# Patient Record
Sex: Female | Born: 1990 | Race: White | Hispanic: No | Marital: Single | Smoking: Never smoker
Health system: Southern US, Community
[De-identification: ages and names within clinical notes are randomized; demographics above are authoritative.]

## PROBLEM LIST (undated history)

## (undated) DIAGNOSIS — F419 Anxiety disorder, unspecified: Secondary | ICD-10-CM

## (undated) DIAGNOSIS — R519 Headache, unspecified: Secondary | ICD-10-CM

## (undated) DIAGNOSIS — R51 Headache: Secondary | ICD-10-CM

---
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
## 2017-06-09 ENCOUNTER — Other Ambulatory Visit: Payer: Self-pay

## 2017-06-09 ENCOUNTER — Encounter (HOSPITAL_COMMUNITY): Payer: Self-pay | Admitting: Emergency Medicine

## 2017-06-09 ENCOUNTER — Emergency Department (HOSPITAL_COMMUNITY)
Admission: EM | Admit: 2017-06-09 | Discharge: 2017-06-09 | Disposition: A | Discharge status: Home / Self Care | Payer: BLUE CROSS/BLUE SHIELD | Attending: Emergency Medicine | Admitting: Emergency Medicine

## 2017-06-09 ENCOUNTER — Emergency Department (HOSPITAL_COMMUNITY)
Admission: EM | Admit: 2017-06-09 | Discharge: 2017-06-09 | Discharge status: Against Medical Advice | Payer: BLUE CROSS/BLUE SHIELD | Source: Home / Self Care

## 2017-06-09 DIAGNOSIS — Z79899 Other long term (current) drug therapy: Secondary | ICD-10-CM | POA: Diagnosis not present

## 2017-06-09 DIAGNOSIS — L923 Foreign body granuloma of the skin and subcutaneous tissue: Secondary | ICD-10-CM

## 2017-06-09 DIAGNOSIS — L089 Local infection of the skin and subcutaneous tissue, unspecified: Secondary | ICD-10-CM | POA: Diagnosis not present

## 2017-06-09 MED ORDER — SULFAMETHOXAZOLE-TRIMETHOPRIM 800-160 MG PO TABS
1.0000 | ORAL_TABLET | Freq: Once | ORAL | Status: AC
  Administered 2017-06-09: 1 via ORAL
  Filled 2017-06-09: qty 1

## 2017-06-09 MED ORDER — SULFAMETHOXAZOLE-TRIMETHOPRIM 800-160 MG PO TABS: 1.0000 | ORAL_TABLET | Freq: Two times a day (BID) | ORAL | 0 refills | Status: AC

## 2017-06-09 MED ORDER — DIPHENHYDRAMINE HCL 25 MG PO CAPS
25.0000 mg | ORAL_CAPSULE | Freq: Once | ORAL | Status: DC
  Filled 2017-06-09: qty 1

## 2017-06-09 NOTE — ED Triage Notes (Signed)
Pt had tattoo 2 days ago, pt developed redness and irritation in R upper arm since yesterday. Pt states arm feels edematous.

## 2017-06-09 NOTE — ED Provider Notes (Signed)
[facility] DEPT Provider Note   CSN: [id number] Arrival date & time: 06/09/17  1546     History   Chief Complaint Chief Complaint  Patient presents with  . erythema R upper arm    HPI Angela Hurley is a 26 y.o. female who presents to the ED with redness and irritation to the right upper arm that started yesterday. Patient reports having a new tattoo 2 days ago. Patient states she took a picture of the area and sent to her PCP and was told to come to the ED. Patient states the redness is much less now than in the picture. Patient denies fever, chills other other problems.   HPI  History reviewed. No pertinent past medical history.  There are no active problems to display for this patient.   History reviewed. No pertinent surgical history.  OB History    No data available       Home Medications    Prior to Admission medications   Medication Sig Start Date End Date Taking? Authorizing Provider  CRYSELLE-28 0.3-30 MG-MCG tablet Take 1 tablet by mouth daily. 05/21/17   [provider]  DULoxetine (CYMBALTA) 30 MG capsule Take 60 mg by mouth daily. 05/15/17   [provider]  LORazepam (ATIVAN) 0.5 MG tablet Take 0.5 mg by mouth daily as needed. 04/17/17   [provider]  sulfamethoxazole-trimethoprim (BACTRIM DS,SEPTRA DS) 800-160 MG tablet Take 1 tablet by mouth 2 (two) times daily for 7 days. 06/09/17 06/16/17  Ashley Murrain, NP    Family History History reviewed. No pertinent family history.  Social History Social History   Tobacco Use  . Smoking status: Never Smoker  . Smokeless tobacco: Never Used  Substance Use Topics  . Alcohol use: No    Frequency: Never  . Drug use: No     Allergies   Doxycycline and Penicillins   Review of Systems Review of Systems  Skin: Positive for color change and rash.  All other systems reviewed and are negative.    Physical Exam Updated Vital Signs BP (!)  146/98 (BP Location: Left Arm)   Pulse (!) 102   Temp 98.4 F (36.9 C) (Oral)   Resp 18   LMP 05/28/2017 (Exact Date)   SpO2 94%   Physical Exam  Constitutional: She appears well-developed and well-nourished. No distress.  HENT:  Head: Normocephalic.  Eyes: EOM are normal.  Neck: Neck supple.  Cardiovascular: Normal rate.  Pulmonary/Chest: Effort normal.  Musculoskeletal: Normal range of motion.  Right upper arm with new large tattoo. There is mild erythema to the palmar aspect of the right upper arm, no red streaking, no drainage. Radial pulse 2+, adequate circulation.   Neurological: She is alert.  Skin: Skin is warm and dry. Rash noted.  Psychiatric: She has a normal mood and affect. Her behavior is normal.  Nursing note and vitals reviewed.    ED Treatments / Results  Labs (all labs ordered are listed, but only abnormal results are displayed) Labs Reviewed - No data to display   Radiology No results found.  Procedures Procedures (including critical care time)  Medications Ordered in ED Medications  sulfamethoxazole-trimethoprim (BACTRIM DS,SEPTRA DS) 800-160 MG per tablet 1 tablet (not administered)  diphenhydrAMINE (BENADRYL) capsule 25 mg (not administered)     Initial Impression / Assessment and Plan / ED Course  I have reviewed the triage vital signs and the nursing notes. 26 y.o. female with erythema to the right upper  arm that has improved since initially noted. Patient stable for d/c without fever, red streaking or drainage noted. Will treat for possible early infection and for possible allergic reaction. Patient allergic to Penicillin and Doxycycline. Will start Bactrim and she will f/u with her PCP or return here for worsening symptoms.   Final Clinical Impressions(s) / ED Diagnoses   Final diagnoses:  Skin infection  Tattoo reaction    ED Discharge Orders        Ordered    sulfamethoxazole-trimethoprim (BACTRIM DS,SEPTRA DS) 800-160 MG tablet  2  times daily     06/09/17 1901       Debroah Baller Embden, NP 06/09/17 Pauline Aus    Charlesetta Shanks, MD 06/13/17 [id number]

## 2017-06-09 NOTE — Discharge Instructions (Signed)
We are treating for your possible allergic reaction to the color in the tattoo or for possible infection of the skin due to the new tattoo. Take the antibiotic and take Benadryl. Follow up with your primary care doctor or return here for worsening symptoms.

## 2017-12-20 ENCOUNTER — Encounter (HOSPITAL_COMMUNITY): Payer: Self-pay | Admitting: *Deleted

## 2017-12-20 ENCOUNTER — Other Ambulatory Visit: Payer: Self-pay

## 2017-12-20 ENCOUNTER — Inpatient Hospital Stay (HOSPITAL_COMMUNITY)
Admission: AD | Admit: 2017-12-20 | Discharge: 2017-12-20 | Disposition: A | Discharge status: Home / Self Care | Payer: BLUE CROSS/BLUE SHIELD | Attending: Obstetrics & Gynecology | Admitting: Obstetrics & Gynecology

## 2017-12-20 DIAGNOSIS — N939 Abnormal uterine and vaginal bleeding, unspecified: Secondary | ICD-10-CM | POA: Diagnosis not present

## 2017-12-20 DIAGNOSIS — Z880 Allergy status to penicillin: Secondary | ICD-10-CM | POA: Diagnosis not present

## 2017-12-20 DIAGNOSIS — R109 Unspecified abdominal pain: Secondary | ICD-10-CM | POA: Insufficient documentation

## 2017-12-20 DIAGNOSIS — Z881 Allergy status to other antibiotic agents status: Secondary | ICD-10-CM | POA: Insufficient documentation

## 2017-12-20 DIAGNOSIS — Z3202 Encounter for pregnancy test, result negative: Secondary | ICD-10-CM

## 2017-12-20 DIAGNOSIS — R51 Headache: Secondary | ICD-10-CM | POA: Insufficient documentation

## 2017-12-20 HISTORY — DX: Headache: R51

## 2017-12-20 HISTORY — DX: Headache, unspecified: R51.9

## 2017-12-20 LAB — URINALYSIS, ROUTINE W REFLEX MICROSCOPIC
BACTERIA UA: NONE SEEN
Bilirubin Urine: NEGATIVE
Glucose, UA: NEGATIVE mg/dL
KETONES UR: NEGATIVE mg/dL
Leukocytes, UA: NEGATIVE
Nitrite: NEGATIVE
PH: 8 (ref 5.0–8.0)
Protein, ur: NEGATIVE mg/dL
SPECIFIC GRAVITY, URINE: 1.016 (ref 1.005–1.030)

## 2017-12-20 LAB — CBC
HEMATOCRIT: 40.3 % (ref 36.0–46.0)
HEMOGLOBIN: 13.5 g/dL (ref 12.0–15.0)
MCH: 30.8 pg (ref 26.0–34.0)
MCHC: 33.5 g/dL (ref 30.0–36.0)
MCV: 91.8 fL (ref 78.0–100.0)
PLATELETS: 347 10*3/uL (ref 150–400)
RBC: 4.39 MIL/uL (ref 3.87–5.11)
RDW: 13.2 % (ref 11.5–15.5)
WBC: 7.9 10*3/uL (ref 4.0–10.5)

## 2017-12-20 LAB — POCT PREGNANCY, URINE: Preg Test, Ur: NEGATIVE

## 2017-12-20 MED ORDER — MEGESTROL ACETATE 40 MG PO TABS: 40.0000 mg | ORAL_TABLET | Freq: Three times a day (TID) | ORAL | 0 refills | Status: DC

## 2017-12-20 NOTE — MAU Note (Signed)
Started having vaginal bleeding on July 4th; has not stopped.  +blood clots "golf ball to fist sized clots" +fatigue +lower abdominal and back pain Rating pain 5/10. Has tried Pamprin last night with no relief Yesterday to now bleeding through pads and tampons; 3 times it went through her clothes. Reports changing tampon and pad every 10 minutes.  This has been a problem for the last 4-5 months with heavy periods and spotting between.  Vitals:   12/20/17 0958  BP: 138/89  Pulse: 98  Resp: 18  Temp: 98.1 F (36.7 C)  SpO2: 98%

## 2017-12-20 NOTE — MAU Provider Note (Signed)
History     CSN: [id number]  Arrival date and time: 12/20/17 0941   First Provider Initiated Contact with Patient 12/20/17 1018      Chief Complaint  Patient presents with  . Vaginal Bleeding   HPI  Angela Hurley is a 27 y.o. non pregnant female who presents with vaginal bleeding.  Reports heavier periods for the last 4 months. Current bleeding started on 7/4. Has alternated heavy & light but extremely heavy since yesterday. Reports bleeding through a tampon and pad 4 times last night. Has been passing clots that are golf ball to fist sized. Has also had worsening abdominal cramping. Rates pain 6/10. Has been taking pamprin with minimal relief. Denies any change in medications and states she doesn't miss doses of her OCPs. Has been on OCPs since she was 27 yrs old.  Has seen her PCP for menstrual issues and has not yet been referred to ob/gyn. Presented today d/t heavy bleeding.  She has not been sexually active since October 2018. Had a normal pap smear 2 years ago.   Past Medical History:  Diagnosis Date  . Headache     Past Surgical History:  Procedure Laterality Date  . WISDOM TOOTH EXTRACTION Bilateral 2008    History reviewed. No pertinent family history.  Social History   Tobacco Use  . Smoking status: Never Smoker  . Smokeless tobacco: Never Used  Substance Use Topics  . Alcohol use: No    Frequency: Never  . Drug use: No    Allergies:  Allergies  Allergen Reactions  . Doxycycline Nausea And Vomiting  . Penicillins Hives    Medications Prior to Admission  Medication Sig Dispense Refill Last Dose  . CRYSELLE-28 0.3-30 MG-MCG tablet Take 1 tablet by mouth daily.  2 12/19/2017 at Unknown time  . DULoxetine (CYMBALTA) 30 MG capsule Take 60 mg by mouth daily.  2 12/19/2017 at Unknown time  . LORazepam (ATIVAN) 0.5 MG tablet Take 0.5 mg by mouth daily as needed.  0 Unknown at Unknown time    Review of Systems  Constitutional: Negative.    Gastrointestinal: Positive for abdominal pain. Negative for constipation, diarrhea, nausea and vomiting.  Genitourinary: Positive for menstrual problem and vaginal bleeding.   Physical Exam   Blood pressure 131/76, pulse 87, temperature 98.1 F (36.7 C), temperature source Oral, resp. rate 18, weight 205 lb 0.6 oz (93 kg), last menstrual period 12/11/2017, SpO2 98 %.  Physical Exam  Nursing note and vitals reviewed. Constitutional: She is oriented to person, place, and time. She appears well-developed and well-nourished. No distress.  HENT:  Head: Normocephalic and atraumatic.  Eyes: Conjunctivae are normal. Right eye exhibits no discharge. Left eye exhibits no discharge. No scleral icterus.  Neck: Normal range of motion.  Cardiovascular: Normal rate, regular rhythm and normal heart sounds.  No murmur heard. Respiratory: Effort normal and breath sounds normal. No respiratory distress. She has no wheezes.  GI: Soft. There is no tenderness.  Genitourinary: Uterus is enlarged. Uterus is not tender. Cervix exhibits no motion tenderness and no friability. Right adnexum displays no mass and no tenderness. Left adnexum displays no mass and no tenderness. There is bleeding in the vagina.  Genitourinary Comments: No clots. Small amount of dark red blood cleared out with 1 fox swab.   Neurological: She is alert and oriented to person, place, and time.  Skin: Skin is warm and dry. She is not diaphoretic.  Psychiatric: She has a normal mood and affect. Her  behavior is normal. Judgment and thought content normal.    MAU Course  Procedures Results for orders placed or performed during the hospital encounter of 12/20/17 (from the past 24 hour(s))  Urinalysis, Routine w reflex microscopic     Status: Abnormal   Collection Time: 12/20/17 10:02 AM  Result Value Ref Range   Color, Urine YELLOW YELLOW   APPearance CLEAR CLEAR   Specific Gravity, Urine 1.016 1.005 - 1.030   pH 8.0 5.0 - 8.0    Glucose, UA NEGATIVE NEGATIVE mg/dL   Hgb urine dipstick MODERATE (A) NEGATIVE   Bilirubin Urine NEGATIVE NEGATIVE   Ketones, ur NEGATIVE NEGATIVE mg/dL   Protein, ur NEGATIVE NEGATIVE mg/dL   Nitrite NEGATIVE NEGATIVE   Leukocytes, UA NEGATIVE NEGATIVE   RBC / HPF 0-5 0 - 5 RBC/hpf   WBC, UA 0-5 0 - 5 WBC/hpf   Bacteria, UA NONE SEEN NONE SEEN  Pregnancy, urine POC     Status: None   Collection Time: 12/20/17 10:05 AM  Result Value Ref Range   Preg Test, Ur NEGATIVE NEGATIVE  CBC     Status: None   Collection Time: 12/20/17 10:53 AM  Result Value Ref Range   WBC 7.9 4.0 - 10.5 K/uL   RBC 4.39 3.87 - 5.11 MIL/uL   Hemoglobin 13.5 12.0 - 15.0 g/dL   HCT 40.3 36.0 - 46.0 %   MCV 91.8 78.0 - 100.0 fL   MCH 30.8 26.0 - 34.0 pg   MCHC 33.5 30.0 - 36.0 g/dL   RDW 13.2 11.5 - 15.5 %   Platelets 347 150 - 400 K/uL    MDM UPT negative CBC -- hemoglobin 13.5 VSS Small amount of blood on exam Declines pain meds at this time  Assessment and Plan  A: 1. Abnormal uterine bleeding (AUB)   2. Pregnancy examination or test, negative result    P: Discharge home D/c OCPs Rx megace 40 mg TID (can decreased to BID when bleeding resolves) Ibuprofen on schedule  Msg to [facility] for f/u appt Outpatient pelvic ultrasound ordered  Jorje Guild 12/20/2017, 10:19 AM

## 2017-12-20 NOTE — Discharge Instructions (Signed)
Dysfunctional Uterine Bleeding °Dysfunctional uterine bleeding is abnormal bleeding from the uterus. Dysfunctional uterine bleeding includes: °· A period that comes earlier or later than usual. °· A period that is lighter, heavier, or has blood clots. °· Bleeding between periods. °· Skipping one or more periods. °· Bleeding after sexual intercourse. °· Bleeding after menopause. ° °Follow these instructions at home: °Pay attention to any changes in your symptoms. Follow these instructions to help with your condition: °Eating and drinking °· Eat well-balanced meals. Include foods that are high in iron, such as liver, meat, shellfish, green leafy vegetables, and eggs. °· If you become constipated: °? Drink plenty of water. °? Eat fruits and vegetables that are high in water and fiber, such as spinach, carrots, raspberries, apples, and mango. °Medicines °· Take over-the-counter and prescription medicines only as told by your health care provider. °· Do not change medicines without talking with your health care provider. °· Aspirin or medicines that contain aspirin may make the bleeding worse. Do not take those medicines: °? During the week before your period. °? During your period. °· If you were prescribed iron pills, take them as told by your health care provider. Iron pills help to replace iron that your body loses because of this condition. °Activity °· If you need to change your sanitary pad or tampon more than one time every 2 hours: °? Lie in bed with your feet raised (elevated). °? Place a cold pack on your lower abdomen. °? Rest as much as possible until the bleeding stops or slows down. °· Do not try to lose weight until the bleeding has stopped and your blood iron level is back to normal. °Other Instructions °· For two months, write down: °? When your period starts. °? When your period ends. °? When any abnormal bleeding occurs. °? What problems you notice. °· Keep all follow up visits as told by your health  care provider. This is important. °Contact a health care provider if: °· You get light-headed or weak. °· You have nausea and vomiting. °· You cannot eat or drink without vomiting. °· You feel dizzy or have diarrhea while you are taking medicines. °· You are taking birth control pills or hormones, and you want to change them or stop taking them. °Get help right away if: °· You develop a fever or chills. °· You need to change your sanitary pad or tampon more than one time per hour. °· Your bleeding becomes heavier, or your flow contains clots more often. °· You develop pain in your abdomen. °· You lose consciousness. °· You develop a rash. °This information is not intended to replace advice given to you by your health care provider. Make sure you discuss any questions you have with your health care provider. °Document Released: 05/24/2000 Document Revised: 11/02/2015 Document Reviewed: 08/22/2014 °Elsevier Interactive Patient Education © 2018 [employer]. ° °

## 2018-01-06 ENCOUNTER — Ambulatory Visit (HOSPITAL_COMMUNITY)
Admission: RE | Admit: 2018-01-06 | Discharge: 2018-01-06 | Disposition: A | Discharge status: Home / Self Care | Payer: BLUE CROSS/BLUE SHIELD | Source: Ambulatory Visit | Attending: Student | Admitting: Student

## 2018-01-06 DIAGNOSIS — N939 Abnormal uterine and vaginal bleeding, unspecified: Secondary | ICD-10-CM | POA: Diagnosis present

## 2018-01-06 DIAGNOSIS — D250 Submucous leiomyoma of uterus: Secondary | ICD-10-CM | POA: Insufficient documentation

## 2018-01-08 ENCOUNTER — Ambulatory Visit (INDEPENDENT_AMBULATORY_CARE_PROVIDER_SITE_OTHER): Payer: BLUE CROSS/BLUE SHIELD | Admitting: Obstetrics & Gynecology

## 2018-01-08 ENCOUNTER — Encounter (HOSPITAL_COMMUNITY): Payer: Self-pay

## 2018-01-08 ENCOUNTER — Encounter: Payer: Self-pay | Admitting: Obstetrics & Gynecology

## 2018-01-08 DIAGNOSIS — D250 Submucous leiomyoma of uterus: Secondary | ICD-10-CM | POA: Diagnosis not present

## 2018-01-08 MED ORDER — MEGESTROL ACETATE 40 MG PO TABS: 40.0000 mg | ORAL_TABLET | Freq: Three times a day (TID) | ORAL | 0 refills | Status: DC

## 2018-01-08 MED ORDER — MEGESTROL ACETATE 40 MG PO TABS: 40.0000 mg | ORAL_TABLET | Freq: Two times a day (BID) | ORAL | 1 refills | Status: DC

## 2018-01-08 NOTE — Patient Instructions (Signed)
Hysteroscopy  Hysteroscopy is a procedure used for looking inside the womb (uterus). It may be done for various reasons, including:  · To evaluate abnormal bleeding, fibroid (benign, noncancerous) tumors, polyps, scar tissue (adhesions), and possibly cancer of the uterus.  · To look for lumps (tumors) and other uterine growths.  · To look for causes of why a woman cannot get pregnant (infertility), causes of recurrent loss of pregnancy (miscarriages), or a lost intrauterine device (IUD).  · To perform a sterilization by blocking the fallopian tubes from inside the uterus.    In this procedure, a thin, flexible tube with a tiny light and camera on the end of it (hysteroscope) is used to look inside the uterus. A hysteroscopy should be done right after a menstrual period to be sure you are not pregnant.  LET YOUR HEALTH CARE PROVIDER KNOW ABOUT:  · Any allergies you have.  · All medicines you are taking, including vitamins, herbs, eye drops, creams, and over-the-counter medicines.  · Previous problems you or members of your family have had with the use of anesthetics.  · Any blood disorders you have.  · Previous surgeries you have had.  · Medical conditions you have.  RISKS AND COMPLICATIONS  Generally, this is a safe procedure. However, as with any procedure, complications can occur. Possible complications include:  · Putting a hole in the uterus.  · Excessive bleeding.  · Infection.  · Damage to the cervix.  · Injury to other organs.  · Allergic reaction to medicines.  · Too much fluid used in the uterus for the procedure.    BEFORE THE PROCEDURE  · Ask your health care provider about changing or stopping any regular medicines.  · Do not take aspirin or blood thinners for 1 week before the procedure, or as directed by your health care provider. These can cause bleeding.  · If you smoke, do not smoke for 2 weeks before the procedure.  · In some cases, a medicine is placed in the cervix the day before the procedure.  This medicine makes the cervix have a larger opening (dilate). This makes it easier for the instrument to be inserted into the uterus during the procedure.  · Do not eat or drink anything for at least 8 hours before the surgery.  · Arrange for someone to take you home after the procedure.  PROCEDURE  · You may be given a medicine to relax you (sedative). You may also be given one of the following:  ? A medicine that numbs the area around the cervix (local anesthetic).  ? A medicine that makes you sleep through the procedure (general anesthetic).  · The hysteroscope is inserted through the vagina into the uterus. The camera on the hysteroscope sends a picture to a TV screen. This gives the surgeon a good view inside the uterus.  · During the procedure, air or a liquid is put into the uterus, which allows the surgeon to see better.  · Sometimes, tissue is gently scraped from inside the uterus. These tissue samples are sent to a lab for testing.  What to expect after the procedure  · If you had a general anesthetic, you may be groggy for a couple hours after the procedure.  · If you had a local anesthetic, you will be able to go home as soon as you are stable and feel ready.  · You may have some cramping. This normally lasts for a couple days.  · You may   have bleeding, which varies from light spotting for a few days to menstrual-like bleeding for 3-7 days. This is normal.  · If your test results are not back during the visit, make an appointment with your health care provider to find out the results.  This information is not intended to replace advice given to you by your health care provider. Make sure you discuss any questions you have with your health care provider.  Document Released: 09/02/2000 Document Revised: 11/02/2015 Document Reviewed: 12/24/2012  Elsevier Interactive Patient Education © 2017 Else[employer] Inc[employer]

## 2018-01-08 NOTE — Progress Notes (Signed)
Patient ID: Angela Hurley, female   DOB: 02-05-1991, 27 y.o.   MRN: [id number]  Chief Complaint  Patient presents with  . DUB    HPI Angela Hurley is a 27 y.o. female.  [id number] Patient's last menstrual period was 12/11/2017 (exact date). She was seen in MAU 7/13 with the following Hx : Angela Hurley is a 27 y.o. non pregnant female who presents with vaginal bleeding.  Reports heavier periods for the last 4 months. Current bleeding started on 7/4. Has alternated heavy & light but extremely heavy since yesterday. Reports bleeding through a tampon and pad 4 times last night. Has been passing clots that are golf ball to fist sized. Has also had worsening abdominal cramping. Rates pain 6/10. Has been taking pamprin with minimal relief. Denies any change in medications and states she doesn't miss doses of her OCPs. Has been on OCPs since she was 27 yrs old.  Has seen her PCP for menstrual issues and has not yet been referred to ob/gyn. Presented today d/t heavy bleeding.  She has not been sexually active since October 2018. [place] was done 7/30 and the images were reviewed by me  HPI  Past Medical History:  Diagnosis Date  . Headache     Past Surgical History:  Procedure Laterality Date  . WISDOM TOOTH EXTRACTION Bilateral 2008    No family history on file.  Social History Social History   Tobacco Use  . Smoking status: Never Smoker  . Smokeless tobacco: Never Used  Substance Use Topics  . Alcohol use: No    Frequency: Never  . Drug use: No    Allergies  Allergen Reactions  . Doxycycline Nausea And Vomiting  . Penicillins Hives    Current Outpatient Medications  Medication Sig Dispense Refill  . DULoxetine (CYMBALTA) 30 MG capsule Take 60 mg by mouth daily.  2  . megestrol (MEGACE) 40 MG tablet Take 1 tablet (40 mg total) by mouth 3 (three) times daily. 90 tablet 0   No current facility-administered medications for this visit.     Review of Systems Review of  Systems  Constitutional: Negative.   Gastrointestinal: Negative.   Genitourinary: Positive for menstrual problem (periods lasting up to 10 days for the last 3 months) and pelvic pain (cramps during menses).    Weight 205 lb 1.6 oz (93 kg), last menstrual period 12/11/2017.  Physical Exam Physical Exam  Constitutional: She appears well-developed. No distress.  Cardiovascular: Normal rate.  Pulmonary/Chest: Effort normal.  Genitourinary: Vagina normal and uterus normal. No vaginal discharge found.  Genitourinary Comments: No masses, minimal tenderness  Vitals reviewed.   Data Reviewed CLINICAL DATA:  Patient with heavy vaginal bleeding for 5 months. Lower pelvic pain.  EXAM: TRANSABDOMINAL AND TRANSVAGINAL ULTRASOUND OF PELVIS  TECHNIQUE: Both transabdominal and transvaginal ultrasound examinations of the pelvis were performed. Transabdominal technique was performed for global imaging of the pelvis including uterus, ovaries, adnexal regions, and pelvic cul-de-sac. It was necessary to proceed with endovaginal exam following the transabdominal exam to visualize the endometrium.  COMPARISON:  None  FINDINGS: Uterus  Measurements: 9.4 x 5.0 x 6.1 cm. There is a 4.7 x 3.6 x 4.7 cm submucosal fibroid within the uterine fundus which displaces the endometrium. There is an additional 1.7 x 1.1 x 1.7 cm subserosal fibroid off the posterior uterine fundus.  Endometrium  Thickness: 7 mm.  Displaced due to enlarged uterine fibroid.  Right ovary  Measurements: 3.8 x 2.6 x 1.8 cm. Normal  appearance/no adnexal mass.  Left ovary  Measurements: 2.6 x 2.2 x 2.1 cm. Normal appearance/no adnexal mass.  Other findings  No abnormal free fluid.  IMPRESSION: Large submucosal fibroid within the uterine fundus with mass-effect on the endometrium. This may be the causative etiology for vaginal bleeding.  Endometrium measures 7 mm. If bleeding remains unresponsive  to hormonal or medical therapy, sonohysterogram should be considered for focal lesion work-up. (Ref: Radiological Reasoning: Algorithmic Workup of Abnormal Vaginal Bleeding with Endovaginal Sonography and Sonohysterography. AJR 2008; [id number]:Q91-69)   Electronically Signed   By: Lovey Newcomer M.D.   On: 01/06/2018 12:05  Assessment    DUB and dysmenorrhea with 4.7 cm submucosal fibroid OCP use, now no bleeding on Megace    Plan    We discussed use of IUD for bleeding but I recommend removal of the intracavitary fibroid and then reassess her BCM afterward. Hysteroscopy and myomectomy was recommended. Patient desires surgical management. The risks of surgery were discussed in detail with the patient including but not limited to: bleeding which may require transfusion or reoperation; infection which may require prolonged hospitalization or re-hospitalization and antibiotic therapy; injury to bowel, bladder, ureters and major vessels or other surrounding organs; need for additional procedures including laparotomy; thromboembolic phenomenon, incisional problems and other postoperative or anesthesia complications.  Patient was told that the likelihood that her condition and symptoms will be treated effectively with this surgical management was very high; the postoperative expectations were also discussed in detail. The patient also understands the alternative treatment options which were discussed in full. All questions were answered.  She was told that she will be contacted by our surgical scheduler regarding the time and date of her surgery; routine preoperative instructions of having nothing to eat or drink after midnight on the day prior to surgery and also coming to the hospital 1.5 hours prior to her time of surgery were also emphasized.  She was told she may be called for a preoperative appointment about a week prior to surgery and will be given further preoperative instructions at that visit.  Printed patient education handouts about the procedure were given to the patient to review at home.         Emeterio Reeve 01/08/2018, 9:43 AM

## 2018-01-11 ENCOUNTER — Other Ambulatory Visit: Payer: Self-pay | Admitting: Student

## 2018-01-19 ENCOUNTER — Encounter (HOSPITAL_COMMUNITY): Payer: Self-pay | Admitting: *Deleted

## 2018-01-19 ENCOUNTER — Encounter (HOSPITAL_COMMUNITY): Payer: Self-pay

## 2018-01-19 ENCOUNTER — Other Ambulatory Visit: Payer: Self-pay

## 2018-02-08 ENCOUNTER — Other Ambulatory Visit: Payer: Self-pay | Admitting: Obstetrics & Gynecology

## 2018-02-08 DIAGNOSIS — D250 Submucous leiomyoma of uterus: Secondary | ICD-10-CM

## 2018-02-10 ENCOUNTER — Encounter (HOSPITAL_COMMUNITY): Payer: Self-pay

## 2018-03-02 ENCOUNTER — Other Ambulatory Visit: Payer: Self-pay | Admitting: Obstetrics & Gynecology

## 2018-03-05 ENCOUNTER — Other Ambulatory Visit: Payer: Self-pay | Admitting: Obstetrics & Gynecology

## 2018-03-05 DIAGNOSIS — D250 Submucous leiomyoma of uterus: Secondary | ICD-10-CM

## 2018-03-10 ENCOUNTER — Telehealth: Payer: Self-pay | Admitting: General Practice

## 2018-03-10 NOTE — Telephone Encounter (Signed)
Patient called and left message on nurse voicemail line stating she has surgery scheduled on 10/28 and wants to know what her limits will be after surgery. Called patient and discussed it is usually 1-2 weeks out of work and weight restrictions of no greater than 20 lbs. Discussed with patient Dr Roselie Awkward will cover that the day of her surgery as some things may change based off how that goes. Patient verbalized understanding & had no questions.

## 2018-03-31 ENCOUNTER — Other Ambulatory Visit: Payer: Self-pay | Admitting: Obstetrics & Gynecology

## 2018-03-31 NOTE — Progress Notes (Signed)
Surgery scheduled 10/28

## 2018-04-04 ENCOUNTER — Other Ambulatory Visit: Payer: Self-pay | Admitting: Obstetrics & Gynecology

## 2018-04-04 DIAGNOSIS — D250 Submucous leiomyoma of uterus: Secondary | ICD-10-CM

## 2018-04-06 ENCOUNTER — Ambulatory Visit (HOSPITAL_COMMUNITY)
Admission: RE | Admit: 2018-04-06 | Discharge: 2018-04-06 | Disposition: A | Discharge status: Home / Self Care | Payer: BLUE CROSS/BLUE SHIELD | Source: Ambulatory Visit | Attending: Obstetrics & Gynecology | Admitting: Obstetrics & Gynecology

## 2018-04-06 ENCOUNTER — Ambulatory Visit (HOSPITAL_COMMUNITY): Payer: BLUE CROSS/BLUE SHIELD | Admitting: Anesthesiology

## 2018-04-06 ENCOUNTER — Other Ambulatory Visit: Payer: Self-pay

## 2018-04-06 ENCOUNTER — Encounter (HOSPITAL_COMMUNITY)
Admission: RE | Disposition: A | Discharge status: Home / Self Care | Payer: Self-pay | Source: Ambulatory Visit | Attending: Obstetrics & Gynecology

## 2018-04-06 ENCOUNTER — Encounter (HOSPITAL_COMMUNITY): Payer: Self-pay | Admitting: Anesthesiology

## 2018-04-06 DIAGNOSIS — D250 Submucous leiomyoma of uterus: Secondary | ICD-10-CM | POA: Diagnosis present

## 2018-04-06 DIAGNOSIS — N938 Other specified abnormal uterine and vaginal bleeding: Secondary | ICD-10-CM

## 2018-04-06 DIAGNOSIS — N856 Intrauterine synechiae: Secondary | ICD-10-CM

## 2018-04-06 DIAGNOSIS — Z79818 Long term (current) use of other agents affecting estrogen receptors and estrogen levels: Secondary | ICD-10-CM | POA: Insufficient documentation

## 2018-04-06 DIAGNOSIS — Z6833 Body mass index (BMI) 33.0-33.9, adult: Secondary | ICD-10-CM | POA: Insufficient documentation

## 2018-04-06 DIAGNOSIS — E669 Obesity, unspecified: Secondary | ICD-10-CM | POA: Insufficient documentation

## 2018-04-06 DIAGNOSIS — N946 Dysmenorrhea, unspecified: Secondary | ICD-10-CM | POA: Insufficient documentation

## 2018-04-06 HISTORY — DX: Anxiety disorder, unspecified: F41.9

## 2018-04-06 HISTORY — PX: DILATATION & CURETTAGE/HYSTEROSCOPY WITH MYOSURE: SHX6511

## 2018-04-06 LAB — CBC
HEMATOCRIT: 42.3 % (ref 36.0–46.0)
Hemoglobin: 14.5 g/dL (ref 12.0–15.0)
MCH: 29.6 pg (ref 26.0–34.0)
MCHC: 34.3 g/dL (ref 30.0–36.0)
MCV: 86.3 fL (ref 80.0–100.0)
Platelets: 323 10*3/uL (ref 150–400)
RBC: 4.9 MIL/uL (ref 3.87–5.11)
RDW: 12.9 % (ref 11.5–15.5)
WBC: 8.7 10*3/uL (ref 4.0–10.5)
nRBC: 0 % (ref 0.0–0.2)

## 2018-04-06 LAB — PREGNANCY, URINE: Preg Test, Ur: NEGATIVE

## 2018-04-06 SURGERY — DILATATION & CURETTAGE/HYSTEROSCOPY WITH MYOSURE
Anesthesia: General | Site: Vagina

## 2018-04-06 MED ORDER — SCOPOLAMINE 1 MG/3DAYS TD PT72
MEDICATED_PATCH | TRANSDERMAL | Status: AC
  Administered 2018-04-06: 1.5 mg via TRANSDERMAL
  Filled 2018-04-06: qty 1

## 2018-04-06 MED ORDER — MEPERIDINE HCL 25 MG/ML IJ SOLN: 6.2500 mg | INTRAMUSCULAR | Status: DC | PRN

## 2018-04-06 MED ORDER — FENTANYL CITRATE (PF) 100 MCG/2ML IJ SOLN: 25.0000 ug | INTRAMUSCULAR | Status: DC | PRN

## 2018-04-06 MED ORDER — BUPIVACAINE HCL (PF) 0.5 % IJ SOLN
INTRAMUSCULAR | Status: DC | PRN
  Administered 2018-04-06: 10 mL

## 2018-04-06 MED ORDER — FENTANYL CITRATE (PF) 100 MCG/2ML IJ SOLN
INTRAMUSCULAR | Status: AC
  Filled 2018-04-06: qty 2

## 2018-04-06 MED ORDER — BUPIVACAINE HCL (PF) 0.5 % IJ SOLN
INTRAMUSCULAR | Status: AC
  Filled 2018-04-06: qty 30

## 2018-04-06 MED ORDER — LACTATED RINGERS IV SOLN
INTRAVENOUS | Status: DC
  Administered 2018-04-06: 12:00:00 via INTRAVENOUS

## 2018-04-06 MED ORDER — ONDANSETRON HCL 4 MG/2ML IJ SOLN
INTRAMUSCULAR | Status: DC | PRN
  Administered 2018-04-06: 4 mg via INTRAVENOUS

## 2018-04-06 MED ORDER — MIDAZOLAM HCL 2 MG/2ML IJ SOLN
INTRAMUSCULAR | Status: DC | PRN
  Administered 2018-04-06: 2 mg via INTRAVENOUS

## 2018-04-06 MED ORDER — SODIUM CHLORIDE 0.9 % IR SOLN
Status: DC | PRN
  Administered 2018-04-06 (×2): 3000 mL

## 2018-04-06 MED ORDER — DEXAMETHASONE SODIUM PHOSPHATE 10 MG/ML IJ SOLN
INTRAMUSCULAR | Status: DC | PRN
  Administered 2018-04-06: 4 mg via INTRAVENOUS

## 2018-04-06 MED ORDER — LIDOCAINE HCL (CARDIAC) PF 100 MG/5ML IV SOSY
PREFILLED_SYRINGE | INTRAVENOUS | Status: DC | PRN
  Administered 2018-04-06: 50 mg via INTRAVENOUS

## 2018-04-06 MED ORDER — OXYCODONE-ACETAMINOPHEN 5-325 MG PO TABS: 1.0000 | ORAL_TABLET | Freq: Four times a day (QID) | ORAL | 0 refills | Status: AC | PRN

## 2018-04-06 MED ORDER — FENTANYL CITRATE (PF) 100 MCG/2ML IJ SOLN
INTRAMUSCULAR | Status: DC | PRN
  Administered 2018-04-06 (×2): 50 ug via INTRAVENOUS

## 2018-04-06 MED ORDER — MIDAZOLAM HCL 2 MG/2ML IJ SOLN
INTRAMUSCULAR | Status: AC
  Filled 2018-04-06: qty 2

## 2018-04-06 MED ORDER — PROPOFOL 10 MG/ML IV BOLUS
INTRAVENOUS | Status: DC | PRN
  Administered 2018-04-06: 200 mg via INTRAVENOUS

## 2018-04-06 MED ORDER — HYDROCODONE-ACETAMINOPHEN 7.5-325 MG PO TABS: 1.0000 | ORAL_TABLET | Freq: Once | ORAL | Status: DC | PRN

## 2018-04-06 MED ORDER — METOCLOPRAMIDE HCL 5 MG/ML IJ SOLN: 10.0000 mg | Freq: Once | INTRAMUSCULAR | Status: DC | PRN

## 2018-04-06 MED ORDER — SCOPOLAMINE 1 MG/3DAYS TD PT72
1.0000 | MEDICATED_PATCH | Freq: Once | TRANSDERMAL | Status: DC
  Administered 2018-04-06: 1.5 mg via TRANSDERMAL

## 2018-04-06 MED ORDER — KETOROLAC TROMETHAMINE 30 MG/ML IJ SOLN
INTRAMUSCULAR | Status: DC | PRN
  Administered 2018-04-06: 30 mg via INTRAVENOUS

## 2018-04-06 SURGICAL SUPPLY — 19 items
CANISTER SUCT 3000ML PPV (MISCELLANEOUS) ×2 IMPLANT
CATH FOLEY 2WAY SLVR 30CC 16FR (CATHETERS) IMPLANT
CATH ROBINSON RED A/P 16FR (CATHETERS) ×2 IMPLANT
DEVICE MYOSURE LITE (MISCELLANEOUS) IMPLANT
DEVICE MYOSURE REACH (MISCELLANEOUS) IMPLANT
FILTER ARTHROSCOPY CONVERTOR (FILTER) ×2 IMPLANT
GLOVE BIO SURGEON STRL SZ7 (GLOVE) ×2 IMPLANT
GLOVE BIOGEL PI IND STRL 7.0 (GLOVE) ×2 IMPLANT
GLOVE BIOGEL PI INDICATOR 7.0 (GLOVE) ×2
GOWN STRL REUS W/TWL LRG LVL3 (GOWN DISPOSABLE) ×4 IMPLANT
PACK VAGINAL MINOR WOMEN LF (CUSTOM PROCEDURE TRAY) ×2 IMPLANT
PAD OB MATERNITY 4.3X12.25 (PERSONAL CARE ITEMS) ×2 IMPLANT
PAD PREP 24X48 CUFFED NSTRL (MISCELLANEOUS) ×2 IMPLANT
PLUG CATH AND CAP STER (CATHETERS) IMPLANT
SEAL ROD LENS SCOPE MYOSURE (ABLATOR) ×2 IMPLANT
SYR 30ML LL (SYRINGE) IMPLANT
TOWEL OR 17X24 6PK STRL BLUE (TOWEL DISPOSABLE) ×4 IMPLANT
TUBING AQUILEX INFLOW (TUBING) ×2 IMPLANT
TUBING AQUILEX OUTFLOW (TUBING) ×2 IMPLANT

## 2018-04-06 NOTE — Discharge Instructions (Signed)

## 2018-04-06 NOTE — Anesthesia Postprocedure Evaluation (Signed)
Anesthesia Post Note  Patient: [name]  Procedure(s) Performed: DILATATION & CURETTAGE/HYSTEROSCOPY (N/A Vagina )     Patient location during evaluation: PACU Anesthesia Type: General Level of consciousness: awake and alert and oriented Pain management: pain level controlled Vital Signs Assessment: post-procedure vital signs reviewed and stable Respiratory status: spontaneous breathing, nonlabored ventilation and respiratory function stable Cardiovascular status: blood pressure returned to baseline and stable Postop Assessment: no apparent nausea or vomiting Anesthetic complications: no    Last Vitals:  Vitals:   04/06/18 1315 04/06/18 1330  BP: 128/76 134/82  Pulse: (!) 105 95  Resp: 15 19  Temp:    SpO2: 99% 96%    Last Pain:  Vitals:   04/06/18 1327  PainSc: 2    Pain Goal: Patients Stated Pain Goal: 3 (04/06/18 1327)               Adama Ferber A.

## 2018-04-06 NOTE — H&P (Signed)
Patient ID: VIRGIA KELNER, female   DOB: Dec 04, 1990, 27 y.o.   MRN: [id number]     Chief Complaint  Patient presents with  . DUB    HPI JAE BRUCK is a 27 y.o. female.  G0P0000 Patient's last menstrual period was 12/11/2017 (exact date). She was seen in [facility] 7/13 with the following Hx : Sintia L Watsonis a 27 y.o.non pregnant female who presents with vaginal bleeding.  Reports heavier periods for the last 4 months. Current bleeding started on 7/4. Has alternated heavy &light but extremely heavy since yesterday. Reports bleeding through a tampon and pad 4 times last night. Has been passing clots that are golf ball to fist sized. Has also had worsening abdominal cramping. Rates pain 6/10. Has been taking pamprin with minimal relief. Denies any change in medications and states she doesn't miss doses of her OCPs. Has been on OCPs since she was 27 yrs old.  Has seen her PCP for menstrual issues and has not yet been referred to ob/gyn. Presented  d/t heavy bleeding.  She has not been sexually active since October 2018. [place] was done 7/30  No current bleeding while on Progestin HPI      Past Medical History:  Diagnosis Date  . Headache          Past Surgical History:  Procedure Laterality Date  . WISDOM TOOTH EXTRACTION Bilateral 2008    No family history on file.  Social History Social History        Tobacco Use  . Smoking status: Never Smoker  . Smokeless tobacco: Never Used  Substance Use Topics  . Alcohol use: No    Frequency: Never  . Drug use: No        Allergies  Allergen Reactions  . Doxycycline Nausea And Vomiting  . Penicillins Hives          Current Outpatient Medications  Medication Sig Dispense Refill  . DULoxetine (CYMBALTA) 30 MG capsule Take 60 mg by mouth daily.  2  . megestrol (MEGACE) 40 MG tablet Take 1 tablet (40 mg total) by mouth 3 (three) times daily. 90 tablet 0   No current facility-administered  medications for this visit.     Review of Systems Review of Systems  Constitutional: Negative.   Gastrointestinal: Negative.   Genitourinary: Positive for menstrual problem (periods lasting up to 10 days for the last 3 months) and pelvic pain (cramps during menses).    Blood pressure (!) 148/95, pulse (!) 102, temperature 98.3 F (36.8 C), resp. rate 16, height 5\' 7"  (1.702 m), weight 97.5 kg, last menstrual period 02/04/2018, SpO2 100 %.   Physical Exam Physical Exam  Constitutional: She appears well-developed. No distress.  Cardiovascular: Normal rate.  Pulmonary/Chest: Effort normal.  Genitourinary: Vagina normal and uterus normal. No vaginal discharge found.  Genitourinary Comments: No masses, minimal tenderness  Vitals reviewed.   Data Reviewed CLINICAL DATA: Patient with heavy vaginal bleeding for 5 months. Lower pelvic pain.  EXAM: TRANSABDOMINAL AND TRANSVAGINAL ULTRASOUND OF PELVIS  TECHNIQUE: Both transabdominal and transvaginal ultrasound examinations of the pelvis were performed. Transabdominal technique was performed for global imaging of the pelvis including uterus, ovaries, adnexal regions, and pelvic cul-de-sac. It was necessary to proceed with endovaginal exam following the transabdominal exam to visualize the endometrium.  COMPARISON: None  FINDINGS: Uterus  Measurements: 9.4 x 5.0 x 6.1 cm. There is a 4.7 x 3.6 x 4.7 cm submucosal fibroid within the uterine fundus which displaces the endometrium. There  is an additional 1.7 x 1.1 x 1.7 cm subserosal fibroid off the posterior uterine fundus.  Endometrium  Thickness: 7 mm. Displaced due to enlarged uterine fibroid.  Right ovary  Measurements: 3.8 x 2.6 x 1.8 cm. Normal appearance/no adnexal mass.  Left ovary  Measurements: 2.6 x 2.2 x 2.1 cm. Normal appearance/no adnexal mass.  Other findings  No abnormal free fluid.  IMPRESSION: Large submucosal fibroid within  the uterine fundus with mass-effect on the endometrium. This may be the causative etiology for vaginal bleeding.  Endometrium measures 7 mm. If bleeding remains unresponsive to hormonal or medical therapy, sonohysterogram should be considered for focal lesion work-up. (Ref: Radiological Reasoning: Algorithmic Workup of Abnormal Vaginal Bleeding with Endovaginal Sonography and Sonohysterography. AJR 2008; 401:U27-25)   Electronically Signed By: Lovey Newcomer M.D. On: 01/06/2018 12:05  Assessment    DUB and dysmenorrhea with 4.7 cm submucosal fibroid OCP use, now no bleeding on Megace  Plan    We discussed use of IUD for bleeding but I recommend removal of the intracavitary fibroid and then reassess her BCM afterward. Hysteroscopy and myomectomy was recommended. Patient desires surgical management. The risks of surgery were discussed in detail with the patient including but not limited to: bleeding which may require transfusion or reoperation; infection which may require prolonged hospitalization or re-hospitalization and antibiotic therapy; injury to bowel, bladder, ureters and major vessels or other surrounding organs; need for additional procedures including laparotomy; thromboembolic phenomenon, incisional problems and other postoperative or anesthesia complications.  Patient was told that the likelihood that her condition and symptoms will be treated effectively with this surgical management was very high; the postoperative expectations were also discussed in detail. The patient also understands the alternative treatment options which were discussed in full. All questions were answered.        Emeterio Reeve 04/06/2018 12:19 PM

## 2018-04-06 NOTE — Anesthesia Preprocedure Evaluation (Signed)
Anesthesia Evaluation  Patient identified by MRN, date of birth, ID band Patient awake    Reviewed: Allergy & Precautions, NPO status , Patient's Chart, lab work & pertinent test results  Airway Mallampati: II  TM Distance: >3 FB Neck ROM: Full    Dental no notable dental hx. (+) Teeth Intact   Pulmonary neg pulmonary ROS,    Pulmonary exam normal breath sounds clear to auscultation       Cardiovascular negative cardio ROS Normal cardiovascular exam Rhythm:Regular Rate:Normal     Neuro/Psych  Headaches, Anxiety    GI/Hepatic negative GI ROS, Neg liver ROS,   Endo/Other  Obesity  Renal/GU negative Renal ROS  negative genitourinary   Musculoskeletal negative musculoskeletal ROS (+)   Abdominal (+) + obese,   Peds  Hematology   Anesthesia Other Findings   Reproductive/Obstetrics Fibroid-submucosal AUB                             Anesthesia Physical Anesthesia Plan  ASA: II  Anesthesia Plan: General   Post-op Pain Management:    Induction: Intravenous  PONV Risk Score and Plan: 4 or greater and Scopolamine patch - Pre-op, Midazolam, Ondansetron, Dexamethasone and Treatment may vary due to age or medical condition  Airway Management Planned: LMA  Additional Equipment:   Intra-op Plan:   Post-operative Plan: Extubation in OR  Informed Consent: I have reviewed the patients History and Physical, chart, labs and discussed the procedure including the risks, benefits and alternatives for the proposed anesthesia with the patient or authorized representative who has indicated his/her understanding and acceptance.   Dental advisory given  Plan Discussed with: CRNA and Surgeon  Anesthesia Plan Comments:         Anesthesia Quick Evaluation

## 2018-04-06 NOTE — Op Note (Signed)
PREOPERATIVE DIAGNOSIS:  Abnormal uterine bleeding, submucosal fibroid  POSTOPERATIVE DIAGNOSIS: The same PROCEDURE: Diagnostic hysteroscopy, dilation and curettage. SURGEON:  Dr. Emeterio Reeve   INDICATIONS: 27 y.o. G0P0000  here for scheduled surgery for the aforementioned diagnoses.   Risks of surgery were discussed with the patient including but not limited to: bleeding which may require transfusion; infection which may require antibiotics; injury to uterus or surrounding organs; intrauterine scarring which may impair future fertility; need for additional procedures including laparotomy or laparoscopy; and other postoperative/anesthesia complications. Written informed consent was obtained.    FINDINGS:  A 6 week size uterus.  Intrauterine adhesions, unable to see ostia bilaterally.  ANESTHESIA:   General, paracervical block with 10 ml of 0.5% Marcaine INTRAVENOUS FLUIDS:  1000 ml of LR FLUID DEFICITS:  830 ml of saline ESTIMATED BLOOD LOSS:  Less than 20 ml SPECIMENS: Endometrial curettings sent to pathology COMPLICATIONS:  None immediate.  PROCEDURE DETAILS:  The patient was taken to the operating room where general anesthesia was administered and was found to be adequate.  After an adequate timeout was performed, she was placed in the dorsal lithotomy position and examined; then prepped and draped in the sterile manner.   Her bladder was catheterized for an unmeasured amount of clear, yellow urine. A speculum was then placed in the patient's vagina and a single tooth tenaculum was applied to the anterior lip of the cervix.   A paracervical block using 10 ml of 0.5% Marcaine was administered.  The cervix was sounded to 8 cm and dilated manually with metal dilators to accommodate the  MyoSure hysteroscope.  Once the cervix was dilated, the hysteroscope was inserted under direct visualization using saline as a suspension medium.  The uterine cavity was carefully examined with the findings as noted  above.  Due to adhesions no focal lesion was seen and the ostia were not seen After further careful visualization of the uterine cavity, the hysteroscope was removed under direct visualization.  A sharp curettage was then performed to obtain small amount of endometrial curettings.  The tenaculum was removed from the anterior lip of the cervix and the vaginal speculum was removed after noting good hemostasis.  The patient tolerated the procedure well and was taken to the recovery area awake, extubated and in stable condition.  The patient will be discharged to home as per PACU criteria.  Routine postoperative instructions given.  She was prescribed Percocet.  She will follow up in the clinic for postoperative evaluation.  Woodroe Mode, MD 04/06/2018 1:19 PM

## 2018-04-06 NOTE — Anesthesia Procedure Notes (Signed)
Procedure Name: LMA Insertion Date/Time: 04/06/2018 12:38 PM Performed by: Purvis Kilts, CRNA Pre-anesthesia Checklist: Patient identified, Emergency Drugs available, Suction available, Patient being monitored and Timeout performed Patient Re-evaluated:Patient Re-evaluated prior to induction Oxygen Delivery Method: Circle system utilized Preoxygenation: Pre-oxygenation with 100% oxygen Induction Type: IV induction Ventilation: Mask ventilation without difficulty LMA: LMA inserted LMA Size: 4.0 Number of attempts: 1 Placement Confirmation: positive ETCO2 and breath sounds checked- equal and bilateral Tube secured with: Tape Dental Injury: Teeth and Oropharynx as per pre-operative assessment

## 2018-04-06 NOTE — Transfer of Care (Signed)
Immediate Anesthesia Transfer of Care Note  Patient: Angela Hurley  Procedure(s) Performed: DILATATION & CURETTAGE/HYSTEROSCOPY (N/A Vagina )  Patient Location: PACU  Anesthesia Type:General  Level of Consciousness: awake and alert   Airway & Oxygen Therapy: Patient Spontanous Breathing  Post-op Assessment: Report given to RN and Post -op Vital signs reviewed and stable  Post vital signs: Reviewed and stable  Last Vitals:  Vitals Value Taken Time  BP 132/81 04/06/2018  1:13 PM  Temp    Pulse 105 04/06/2018  1:15 PM  Resp 14 04/06/2018  1:15 PM  SpO2 98 % 04/06/2018  1:15 PM  Vitals shown include unvalidated device data.  Last Pain: There were no vitals filed for this visit.    Patients Stated Pain Goal: 3 (00/63/49 4944)  Complications: No apparent anesthesia complications

## 2018-04-07 ENCOUNTER — Encounter (HOSPITAL_COMMUNITY): Payer: Self-pay | Admitting: Obstetrics & Gynecology

## 2018-04-07 ENCOUNTER — Telehealth: Payer: Self-pay | Admitting: Obstetrics & Gynecology

## 2018-04-07 NOTE — Telephone Encounter (Signed)
Patient had surgery on 10/28, and is requesting the sample that was taken be sent to check for endometriosis.

## 2018-05-06 ENCOUNTER — Encounter: Payer: Self-pay | Admitting: Obstetrics & Gynecology

## 2018-05-06 ENCOUNTER — Ambulatory Visit (INDEPENDENT_AMBULATORY_CARE_PROVIDER_SITE_OTHER): Payer: BLUE CROSS/BLUE SHIELD | Admitting: Obstetrics & Gynecology

## 2018-05-06 VITALS — BP 127/86 | HR 108 | Ht 67.0 in | Wt 214.9 lb

## 2018-05-06 DIAGNOSIS — Z9889 Other specified postprocedural states: Secondary | ICD-10-CM

## 2018-05-06 DIAGNOSIS — D250 Submucous leiomyoma of uterus: Secondary | ICD-10-CM

## 2018-05-06 MED ORDER — CRYSELLE-28 0.3-30 MG-MCG PO TABS: 1.0000 | ORAL_TABLET | Freq: Every day | ORAL | 6 refills | Status: AC

## 2018-05-06 NOTE — Patient Instructions (Signed)
Uterine Fibroids Uterine fibroids are tissue masses (tumors). They are also called leiomyomas. They can develop inside of a woman's womb (uterus). They can grow very large. Fibroids are not cancerous (benign). Most fibroids do not require medical treatment. Follow these instructions at home:  Keep all follow-up visits as told by your doctor. This is important.  Take medicines only as told by your doctor. ? If you were prescribed a hormone treatment, take the hormone medicines exactly as told. ? Do not take aspirin. It can cause bleeding.  Ask your doctor about taking iron pills and increasing the amount of dark green, leafy vegetables in your diet. These actions can help to boost your blood iron levels.  Pay close attention to your period. Tell your doctor about any changes, such as: ? Increased blood flow. This may require you to use more pads or tampons than usual per month. ? A change in the number of days that your period lasts per month. ? A change in symptoms that come with your period, such as back pain or cramping in your belly area (abdomen). Contact a doctor if:  You have pain in your back or the area between your hip bones (pelvic area) that is not controlled by medicines.  You have pain in your abdomen that is not controlled with medicines.  You have an increase in bleeding between and during periods.  You soak tampons or pads in a half hour or less.  You feel lightheaded.  You feel extra tired.  You feel weak. Get help right away if:  You pass out (faint).  You have a sudden increase in pelvic pain. This information is not intended to replace advice given to you by your health care provider. Make sure you discuss any questions you have with your health care provider. Document Released: 06/29/2010 Document Revised: 01/26/2016 Document Reviewed: 11/23/2013 Elsevier Interactive Patient Education  2018 [employer]  

## 2018-05-06 NOTE — Progress Notes (Signed)
Subjective:     Angela Hurley is a 27 y.o. female who presents to the clinic 4 weeks status post diagnostic hysteroscopy for abnormal uterine bleeding and fibroid. Eating a regular diet without difficulty. Bowel movements are normal. The patient is not having any pain.  The following portions of the patient's history were reviewed and updated as appropriate: allergies, current medications, past family history, past medical history, past social history, past surgical history and problem list.  Review of Systems Pertinent items are noted in HPI.    Objective:    BP 127/86   Pulse (!) 108   Ht 5\' 7"  (1.702 m)   Wt 214 lb 14.4 oz (97.5 kg)   LMP 04/16/2018 (Exact Date)   BMI 33.66 kg/m  General:  alert, cooperative and no distress  Abdomen: Not distended        Assessment:    Doing well postoperatively. Operative findings again reviewed. Pathology report discussed.    Plan:    1. Continue any current medications. 2. Wound care discussed. 3. Activity restrictions: none 4. Anticipated return to work: now. 5. Follow up: Kerin Perna for eval of fibroid, intrauterine adhesions, possible endometriosis 6. Hormonal studies requested, ordered  Woodroe Mode, MD 05/06/2018    .

## 2018-05-10 LAB — TESTT+TESTF+SHBG
Sex Hormone Binding: 26.4 nmol/L (ref 24.6–122.0)
TESTOSTERONE FREE: 2.6 pg/mL (ref 0.0–4.2)
TESTOSTERONE, TOTAL: 36.8 ng/dL (ref 10.0–55.0)

## 2018-05-10 LAB — TSH: TSH: 2.26 u[IU]/mL (ref 0.450–4.500)

## 2018-05-10 LAB — DHEA-SULFATE: DHEA-SO4: 284.2 ug/dL (ref 84.8–378.0)

## 2018-05-10 LAB — ESTROGENS, TOTAL: Estrogen: 191 pg/mL

## 2018-05-12 ENCOUNTER — Telehealth: Payer: Self-pay

## 2018-05-12 NOTE — Telephone Encounter (Signed)
Pt called for blood work results and to find out if Dr. Roselie Awkward ordered the BCP [place].

## 2018-05-12 NOTE — Telephone Encounter (Addendum)
-----   Message from Woodroe Mode, MD sent at 05/12/2018 11:34 AM EST ----- Hormonal testing all in normal range  Notified pt of normal results.  Pt stated that was suppose to have a referral [facility] and she called their office and they have not received.  I explained to the pt that I would send information needed today.  Pt stated thank you with no further questions.   [facility] referral form faxed.

## 2018-05-14 NOTE — Telephone Encounter (Signed)
I called Angela Hurley and reviewed her lab results from 05/06/18 with her  ( all normal). She also states she has picked up her birth contol pills now. No other concerns voiced.

## 2019-02-07 IMAGING — US US PELVIS COMPLETE TRANSABD/TRANSVAG
1 series · 15 of 25 positions shown · non-contrast
Comparison: None

CLINICAL DATA: Patient with heavy vaginal bleeding for 5 months.
Lower pelvic pain.



[Series 1: us pelvis complete transabd/transvag · 15 of 98 slices shown]
[im 1/98]
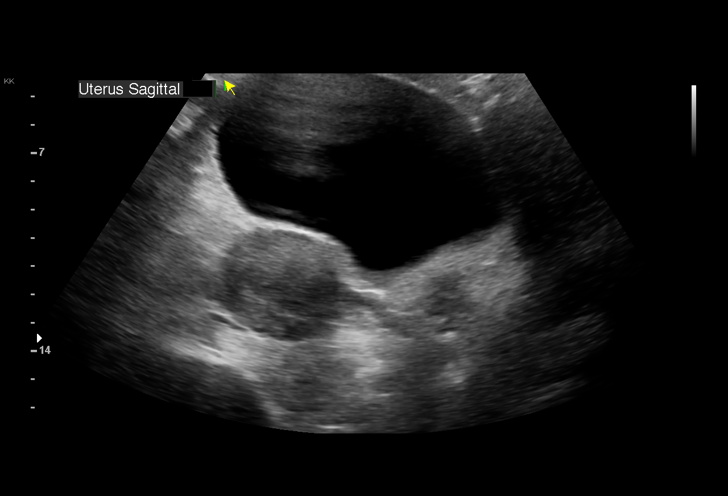
[im 9/98]
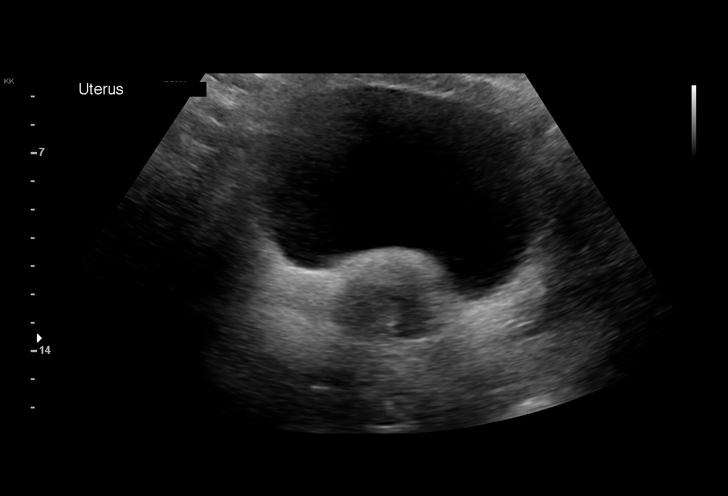
[im 17/98]
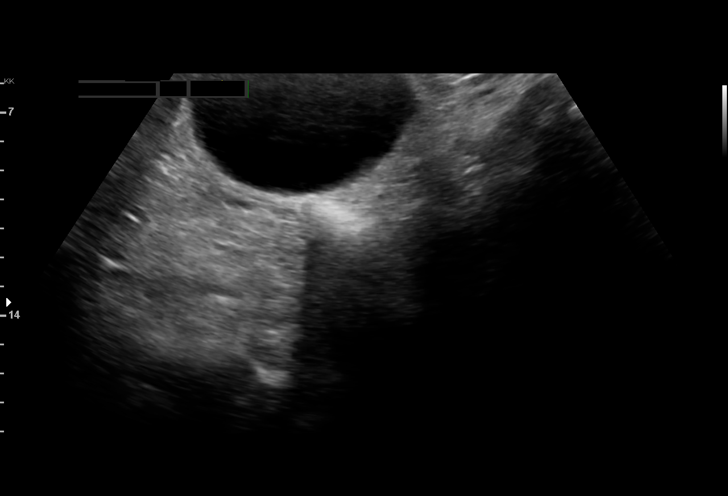
[im 21/98]
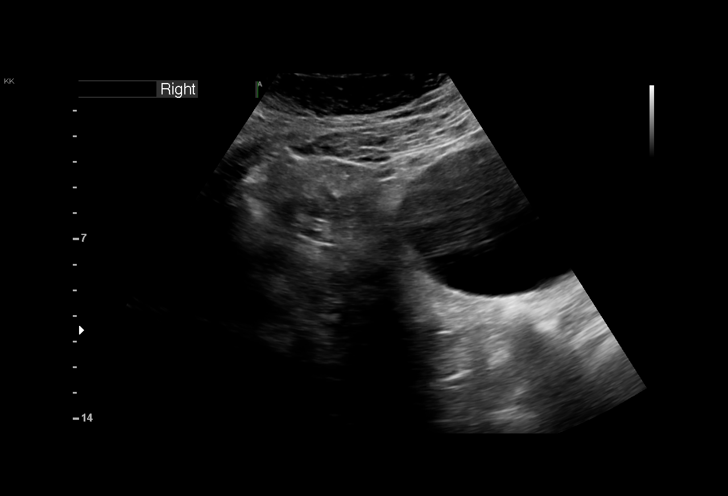
[im 29/98]
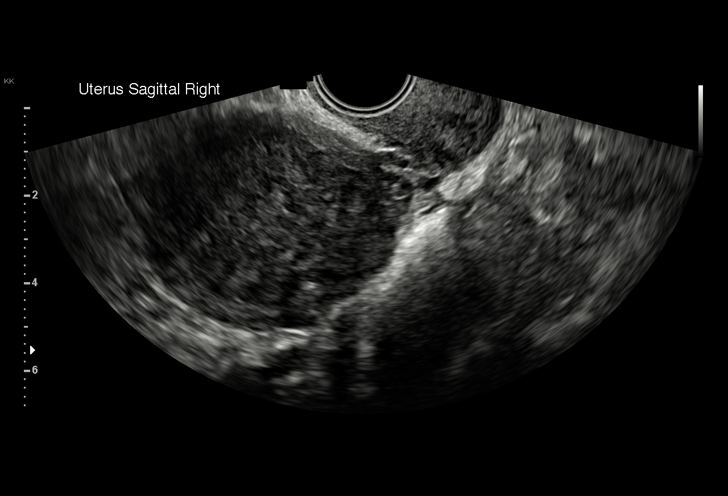
[im 37/98]
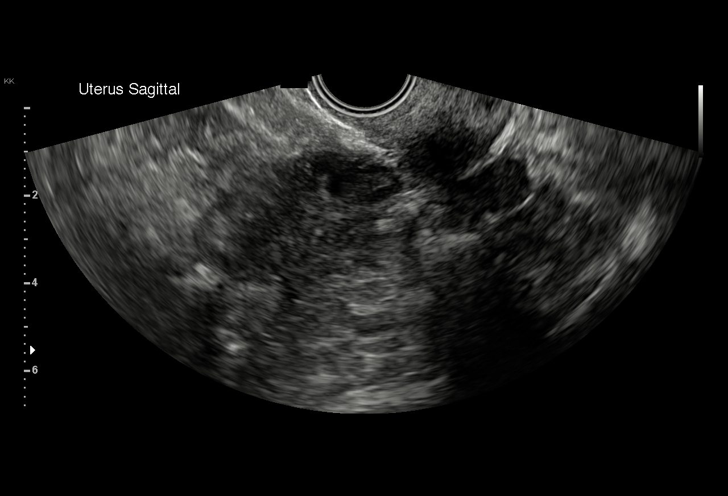
[im 41/98]
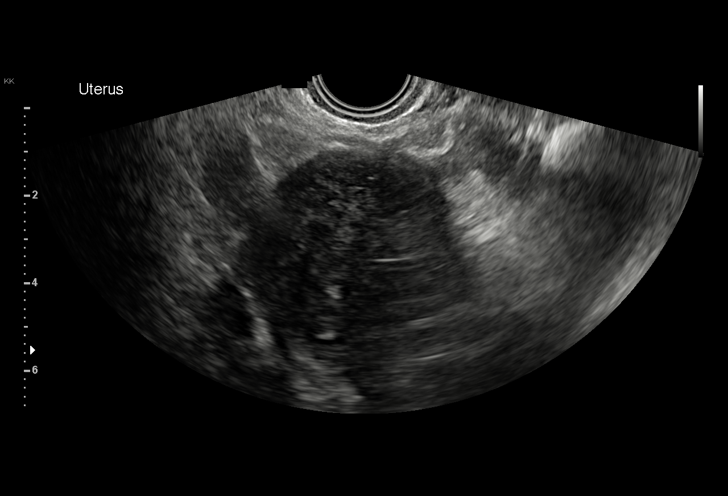
[im 49/98]
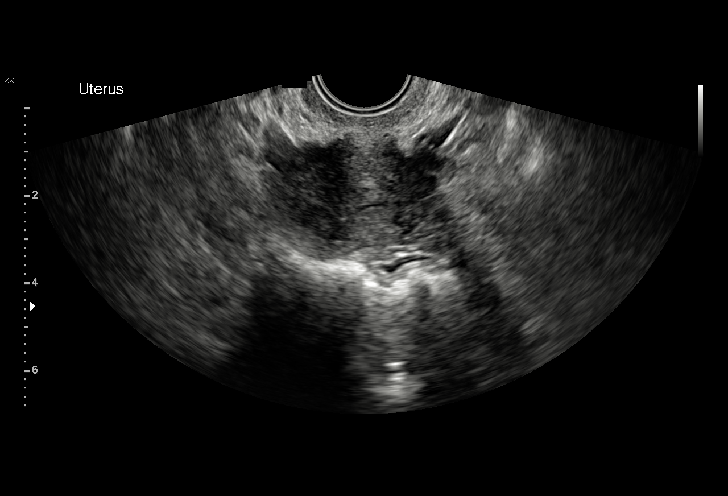
[im 57/98]
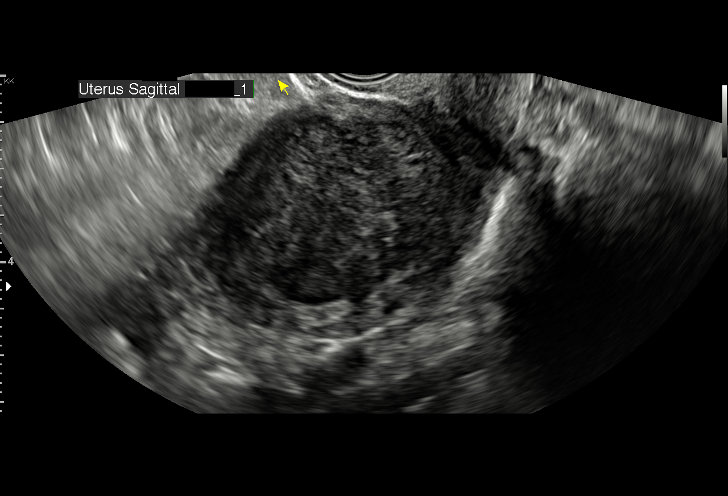
[im 61/98]
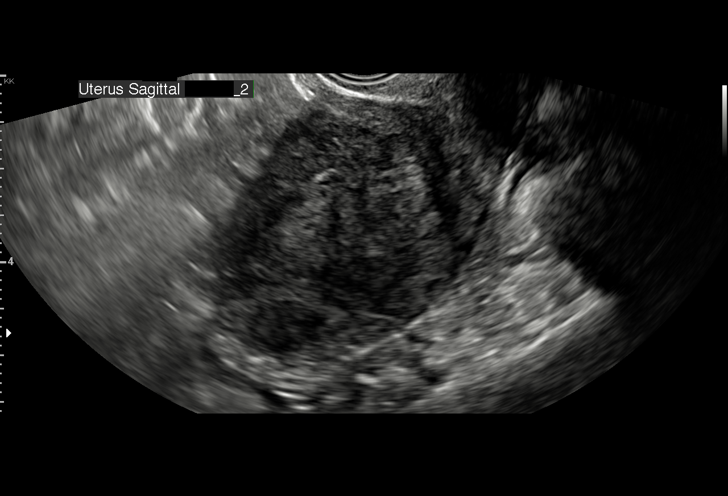
[im 69/98]
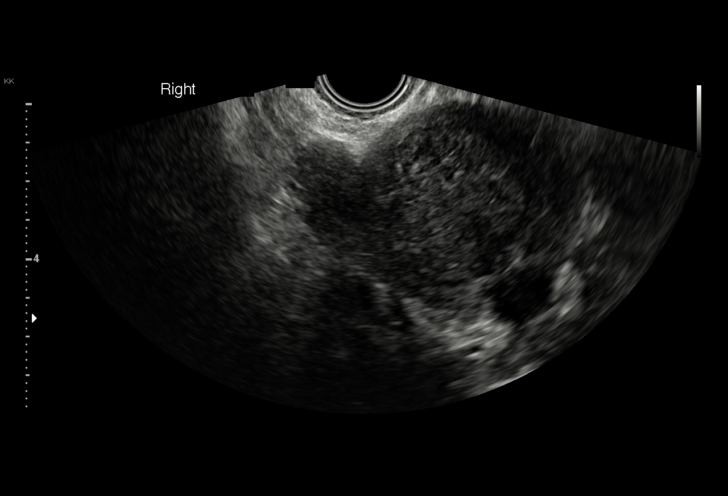
[im 77/98]
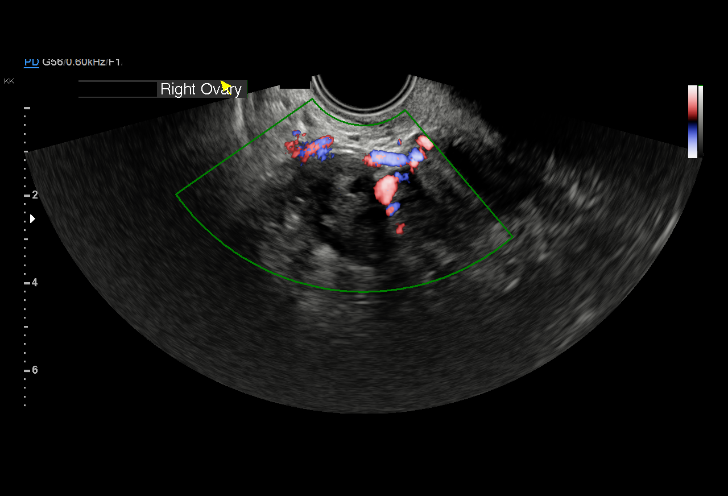
[im 81/98]
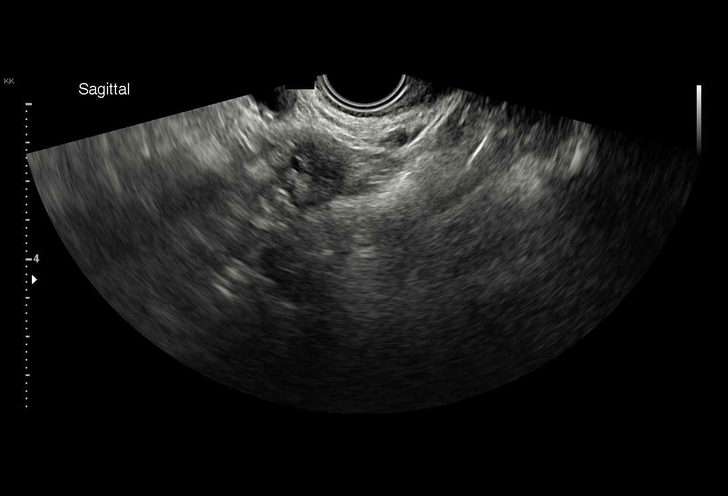
[im 89/98]
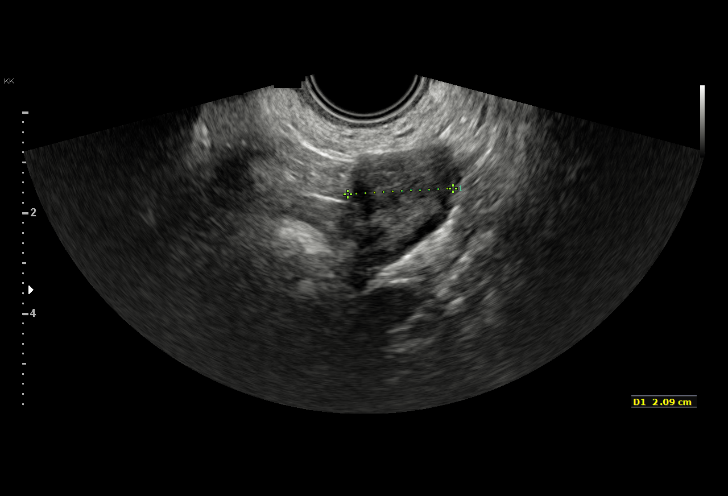
[im 98/98]
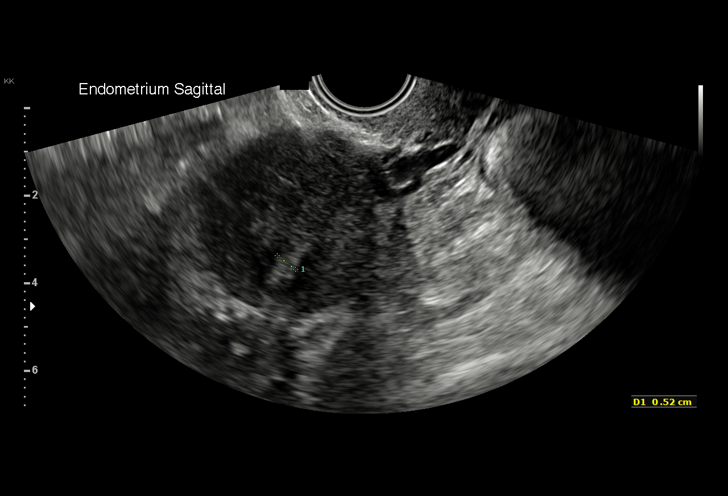

[15 of 25 positions shown; findings below may reference images not displayed]

FINDINGS: Uterus

Measurements: 9.4 x 5.0 x 6.1 cm. There is a 4.7 x 3.6 x 4.7 cm
submucosal fibroid within the uterine fundus which displaces the
endometrium. There is an additional 1.7 x 1.1 x 1.7 cm subserosal
fibroid off the posterior uterine fundus.

Endometrium

Thickness: 7 mm.  Displaced due to enlarged uterine fibroid.

Right ovary

Measurements: 3.8 x 2.6 x 1.8 cm. Normal appearance/no adnexal mass.

Left ovary

Measurements: 2.6 x 2.2 x 2.1 cm. Normal appearance/no adnexal mass.

Other findings

No abnormal free fluid.
IMPRESSION: Large submucosal fibroid within the uterine fundus with mass-effect
on the endometrium. This may be the causative etiology for vaginal
bleeding.

Endometrium measures 7 mm. If bleeding remains unresponsive to
hormonal or medical therapy, sonohysterogram should be considered
for focal lesion work-up. (Ref: Radiological Reasoning: Algorithmic
Workup of Abnormal Vaginal Bleeding with Endovaginal Sonography and
Sonohysterography. AJR 8880; 191:S68-73)

## 2019-11-05 ENCOUNTER — Ambulatory Visit
Admission: EM | Admit: 2019-11-05 | Discharge: 2019-11-05 | Disposition: A | Discharge status: Home / Self Care | Payer: BLUE CROSS/BLUE SHIELD | Attending: Physician Assistant | Admitting: Physician Assistant

## 2019-11-05 ENCOUNTER — Encounter: Payer: Self-pay | Admitting: Emergency Medicine

## 2019-11-05 ENCOUNTER — Other Ambulatory Visit: Payer: Self-pay

## 2019-11-05 DIAGNOSIS — Z23 Encounter for immunization: Secondary | ICD-10-CM | POA: Diagnosis not present

## 2019-11-05 DIAGNOSIS — T148XXA Other injury of unspecified body region, initial encounter: Secondary | ICD-10-CM | POA: Diagnosis not present

## 2019-11-05 MED ORDER — CEPHALEXIN 500 MG PO CAPS: 500.0000 mg | ORAL_CAPSULE | Freq: Four times a day (QID) | ORAL | 0 refills | Status: AC

## 2019-11-05 MED ORDER — MUPIROCIN 2 % EX OINT: 1.0000 "application " | TOPICAL_OINTMENT | Freq: Two times a day (BID) | CUTANEOUS | 0 refills | Status: AC

## 2019-11-05 MED ORDER — TETANUS-DIPHTH-ACELL PERTUSSIS 5-2.5-18.5 LF-MCG/0.5 IM SUSP
0.5000 mL | Freq: Once | INTRAMUSCULAR | Status: AC
  Administered 2019-11-05: 0.5 mL via INTRAMUSCULAR

## 2019-11-05 NOTE — Discharge Instructions (Addendum)
Tetanus updated today. Start keflex as directed. Bactroban for first 3-4 days. Keep wound clean and dry. You can clean gently with soap and water. Do not soak area in water. Dress wound while doing activity and when wound can get dirty. Leave it uncovered when at home to prevent area getting moist. Monitor for spreading redness, increased warmth, increased swelling, fever, follow up for reevaluation needed.

## 2019-11-05 NOTE — ED Provider Notes (Signed)
[facility]    CSN: [id number] Arrival date & time: 11/05/19  1732      History   Chief Complaint Chief Complaint  Patient presents with  . Wound Check    HPI Angela Hurley is a 29 y.o. female.   29 year old female comes in for wound check after injury earlier today. Was removing wired fencing when she sustained puncture wound to the right lateral leg due to the wire. Area was cleaned and dressed with neosporin. Unknown last tetanus.      Past Medical History:  Diagnosis Date  . Anxiety   . Headache    migraines    Patient Active Problem List   Diagnosis Date Noted  . 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 DILATATION & CURETTAGE/HYSTEROSCOPY WITH MYOSURE N/A 04/06/2018   Procedure: DILATATION & CURETTAGE/HYSTEROSCOPY;  Surgeon: Woodroe Mode, MD;  Location: [facility];  Service: Gynecology;  Laterality: N/A;  . WISDOM TOOTH EXTRACTION Bilateral 2008    OB History    Gravida  0   Para  0   Term  0   Preterm  0   AB  0   Living  0     SAB  0   TAB  0   Ectopic  0   Multiple  0   Live Births  0            Home Medications    Prior to Admission medications   Medication Sig Start Date End Date Taking? Authorizing Provider  cephALEXin (KEFLEX) 500 MG capsule Take 1 capsule (500 mg total) by mouth 4 (four) times daily. 11/05/19   Tasia Catchings, Raeghan Demeter V, PA-C  cetirizine (ZYRTEC) 10 MG tablet Take 10 mg by mouth daily.    [provider]  CRYSELLE-28 0.3-30 MG-MCG tablet Take 1 tablet by mouth daily. 05/06/18   Woodroe Mode, MD  DULoxetine (CYMBALTA) 60 MG capsule Take 60 mg by mouth at bedtime.    [provider]  Ibuprofen (ADVIL MIGRAINE) 200 MG CAPS Take 400 mg by mouth 2 (two) times daily as needed (headaches).    [provider]  megestrol (MEGACE) 40 MG tablet Take 1 tablet (40 mg total) by mouth at bedtime. Patient not  taking: Reported on 05/06/2018 04/23/18   Woodroe Mode, MD  mupirocin ointment (BACTROBAN) 2 % Apply 1 application topically 2 (two) times daily. 11/05/19   Abie Killian V, PA-C  OVER THE COUNTER MEDICATION Take 1 tablet by mouth at bedtime. CBD gummies    [provider]  oxyCODONE-acetaminophen (PERCOCET/ROXICET) 5-325 MG tablet Take 1-2 tablets by mouth every 6 (six) hours as needed. Patient not taking: Reported on 05/06/2018 04/06/18   Woodroe Mode, MD    Family History History reviewed. No pertinent family history.  Social History Social History   Tobacco Use  . Smoking status: Never Smoker  . Smokeless tobacco: Never Used  Substance Use Topics  . Alcohol use: No  . Drug use: No     Allergies   Doxycycline and Penicillins   Review of Systems Review of Systems  Reason unable to perform ROS: See HPI as above.     Physical Exam Triage Vital Signs ED Triage Vitals [11/05/19 1800]  Enc Vitals Group     BP 115/71     Pulse Rate 87     Resp 18     Temp 98.5  F (36.9 C)     Temp Source Oral     SpO2 96 %     Weight      Height      Head Circumference      Peak Flow      Pain Score 7     Pain Loc      Pain Edu?      Excl. in [place]?    No data found.  Updated Vital Signs BP 115/71 (BP Location: Left Arm)   Pulse 87   Temp 98.5 F (36.9 C) (Oral)   Resp 18   SpO2 96%   Visual Acuity Right Eye Distance:   Left Eye Distance:   Bilateral Distance:    Right Eye Near:   Left Eye Near:    Bilateral Near:     Physical Exam Constitutional:      General: She is not in acute distress.    Appearance: Normal appearance. She is well-developed. She is not toxic-appearing or diaphoretic.  HENT:     Head: Normocephalic and atraumatic.  Eyes:     Conjunctiva/sclera: Conjunctivae normal.     Pupils: Pupils are equal, round, and reactive to light.  Pulmonary:     Effort: Pulmonary effort is normal. No respiratory distress.     Comments: Speaking in full  sentences without difficulty Musculoskeletal:     Cervical back: Normal range of motion and neck supple.     Comments: Abrasion with puncture wound to the right lateral leg. Bleeding controlled without pressure. Surrounding swelling without erythema, warmth. Mild tenderness to palpation. Sensation intact.   Skin:    General: Skin is warm and dry.  Neurological:     Mental Status: She is alert and oriented to person, place, and time.      UC Treatments / Results  Labs (all labs ordered are listed, but only abnormal results are displayed) Labs Reviewed - No data to display  EKG   Radiology No results found.  Procedures Procedures (including critical care time)  Medications Ordered in UC Medications  Tdap (BOOSTRIX) injection 0.5 mL (0.5 mLs Intramuscular Given 11/05/19 1820)    Initial Impression / Assessment and Plan / UC Course  I have reviewed the triage vital signs and the nursing notes.  Pertinent labs & imaging results that were available during my care of the patient were reviewed by me and considered in my medical decision making (see chart for details).    Tetanus updated. Due to puncture wound, will start keflex to cover empirically for bacterial infection. Wound care instructions given. Return precautions given.  Final Clinical Impressions(s) / UC Diagnoses   Final diagnoses:  Puncture wound    ED Prescriptions    Medication Sig Dispense Auth. Provider   cephALEXin (KEFLEX) 500 MG capsule Take 1 capsule (500 mg total) by mouth 4 (four) times daily. 28 capsule Landi Biscardi V, PA-C   mupirocin ointment (BACTROBAN) 2 % Apply 1 application topically 2 (two) times daily. 22 g Ok Edwards, PA-C     PDMP not reviewed this encounter.   Ok Edwards, PA-C 11/05/19 1840

## 2019-11-05 NOTE — ED Triage Notes (Signed)
Pt here for wound check to right lower leg after wire from fencing went into leg earlier today; abrasion noted and bleeding controlled; pt sts thinks she needs a tetanus
